# Patient Record
Sex: Female | Born: 1970 | Race: Black or African American | Hispanic: No | Marital: Married | Smoking: Never smoker
Health system: Southern US, Community
[De-identification: ages and names within clinical notes are randomized; demographics above are authoritative.]

## PROBLEM LIST (undated history)

## (undated) DIAGNOSIS — G009 Bacterial meningitis, unspecified: Secondary | ICD-10-CM

## (undated) DIAGNOSIS — J40 Bronchitis, not specified as acute or chronic: Secondary | ICD-10-CM

---

## 2007-06-21 ENCOUNTER — Emergency Department (HOSPITAL_COMMUNITY): Admission: EM | Admit: 2007-06-21 | Discharge: 2007-06-21 | Payer: Self-pay | Admitting: Emergency Medicine

## 2009-10-28 ENCOUNTER — Emergency Department (HOSPITAL_BASED_OUTPATIENT_CLINIC_OR_DEPARTMENT_OTHER): Admission: EM | Admit: 2009-10-28 | Discharge: 2009-10-28 | Payer: Self-pay | Admitting: Emergency Medicine

## 2010-03-25 ENCOUNTER — Emergency Department (HOSPITAL_BASED_OUTPATIENT_CLINIC_OR_DEPARTMENT_OTHER)
Admission: EM | Admit: 2010-03-25 | Discharge: 2010-03-25 | Disposition: A | Discharge status: Home / Self Care | Payer: Self-pay | Attending: Emergency Medicine | Admitting: Emergency Medicine

## 2010-03-25 DIAGNOSIS — R51 Headache: Secondary | ICD-10-CM | POA: Insufficient documentation

## 2010-03-25 DIAGNOSIS — K089 Disorder of teeth and supporting structures, unspecified: Secondary | ICD-10-CM | POA: Insufficient documentation

## 2010-10-21 LAB — URINALYSIS, ROUTINE W REFLEX MICROSCOPIC
Nitrite: NEGATIVE
Specific Gravity, Urine: 1.021
Urobilinogen, UA: 0.2

## 2010-10-21 LAB — PREGNANCY, URINE: Preg Test, Ur: NEGATIVE

## 2010-10-21 LAB — POCT I-STAT, CHEM 8
Creatinine, Ser: 1
HCT: 38
Hemoglobin: 12.9
Potassium: 3.8
Sodium: 138

## 2010-10-21 LAB — DIFFERENTIAL
Eosinophils Absolute: 0.5
Lymphs Abs: 2.4
Neutrophils Relative %: 53

## 2010-10-21 LAB — CBC
MCV: 84.4
Platelets: 265
WBC: 7.6

## 2011-03-06 ENCOUNTER — Emergency Department (HOSPITAL_BASED_OUTPATIENT_CLINIC_OR_DEPARTMENT_OTHER)
Admission: EM | Admit: 2011-03-06 | Discharge: 2011-03-06 | Disposition: A | Discharge status: Home / Self Care | Payer: Self-pay | Attending: Emergency Medicine | Admitting: Emergency Medicine

## 2011-03-06 ENCOUNTER — Encounter (HOSPITAL_BASED_OUTPATIENT_CLINIC_OR_DEPARTMENT_OTHER): Payer: Self-pay | Admitting: Emergency Medicine

## 2011-03-06 ENCOUNTER — Emergency Department (HOSPITAL_BASED_OUTPATIENT_CLINIC_OR_DEPARTMENT_OTHER): Payer: Self-pay

## 2011-03-06 DIAGNOSIS — R059 Cough, unspecified: Secondary | ICD-10-CM | POA: Insufficient documentation

## 2011-03-06 DIAGNOSIS — Z79899 Other long term (current) drug therapy: Secondary | ICD-10-CM | POA: Insufficient documentation

## 2011-03-06 DIAGNOSIS — H109 Unspecified conjunctivitis: Secondary | ICD-10-CM | POA: Insufficient documentation

## 2011-03-06 DIAGNOSIS — J40 Bronchitis, not specified as acute or chronic: Secondary | ICD-10-CM | POA: Insufficient documentation

## 2011-03-06 DIAGNOSIS — R05 Cough: Secondary | ICD-10-CM | POA: Insufficient documentation

## 2011-03-06 HISTORY — DX: Bronchitis, not specified as acute or chronic: J40

## 2011-03-06 MED ORDER — AZITHROMYCIN 250 MG PO TABS: 250.0000 mg | ORAL_TABLET | Freq: Every day | ORAL | Status: AC

## 2011-03-06 MED ORDER — GENTAMICIN SULFATE 0.3 % OP SOLN: 1.0000 [drp] | OPHTHALMIC | Status: AC

## 2011-03-06 MED ORDER — HYDROCOD POLST-CHLORPHEN POLST 10-8 MG/5ML PO LQCR: 5.0000 mL | Freq: Every evening | ORAL | Status: DC | PRN

## 2011-03-06 NOTE — ED Provider Notes (Signed)
History     CSN: [id number]  Arrival date & time 03/06/11  1827   First MD Initiated Contact with Patient 03/06/11 2044      Chief Complaint  Patient presents with  . Cough     HPI Pt states she has a hx of bronchitis and has had a productive cough with thick, white sputum x 1 week. No relief with OTC meds.  Patient also complains of drainage and redness to left eye.  No pain or trauma.  Patient denies fever, nausea, vomiting.  Past Medical History  Diagnosis Date  . Bronchitis     No past surgical history on file.  No family history on file.  History  Substance Use Topics  . Smoking status: Not on file  . Smokeless tobacco: Not on file  . Alcohol Use:     OB History    Grav Para Term Preterm Abortions TAB SAB Ect Mult Living                  Review of Systems Negative except as noted in history of present illness Allergies  Review of patient's allergies indicates no known allergies.  Home Medications   Current Outpatient Rx  Name Route Sig Dispense Refill  . DIPHENHYDRAMINE-APAP (SLEEP) 25-500 MG PO TABS Oral Take 2 tablets by mouth at bedtime as needed. For pain and sleep    . PSEUDOEPH-DOXYLAMINE-DM-APAP 60-7.06-23-998 MG/30ML PO LIQD Oral Take 60 mLs by mouth at bedtime as needed. For cold symptoms    . AZITHROMYCIN 250 MG PO TABS Oral Take 1 tablet (250 mg total) by mouth daily. Take 2 tablets on day one then one tablet daily until gone 6 tablet 0  . HYDROCOD POLST-CPM POLST ER 10-8 MG/5ML PO LQCR Oral Take 5 mLs by mouth at bedtime as needed. 140 mL 0  . GENTAMICIN SULFATE 0.3 % OP SOLN Left Eye Place 1 drop into the left eye every 4 (four) hours. 5 mL 0    BP 112/80  Pulse 102  Temp(Src) 98.7 F (37.1 C) (Oral)  Resp 22  Ht 5\' 5"  (1.651 m)  SpO2 100%  LMP 02/20/2011  Physical Exam  Nursing note and vitals reviewed. Constitutional: She is oriented to person, place, and time. She appears well-developed and well-nourished. No distress.  HENT:    Head: Normocephalic and atraumatic.  Eyes: Pupils are equal, round, and reactive to light. Left eye exhibits discharge. Right conjunctiva is not injected. Left conjunctiva is injected. Left conjunctiva has no hemorrhage. Left eye exhibits normal extraocular motion.  Neck: Normal range of motion.  Cardiovascular: Normal rate and intact distal pulses.   Pulmonary/Chest: Effort normal and breath sounds normal. No respiratory distress.  Abdominal: Normal appearance. She exhibits no distension.  Musculoskeletal: Normal range of motion.  Neurological: She is alert and oriented to person, place, and time. No cranial nerve deficit.  Skin: Skin is warm and dry. No rash noted.  Psychiatric: She has a normal mood and affect. Her behavior is normal.    ED Course  Procedures (including critical care time)   No current facility-administered medications on file prior to encounter.   No current outpatient prescriptions on file prior to encounter.     1. Bronchitis   2. Conjunctivitis               Nelia Shi, MD 03/06/11 2059

## 2011-03-06 NOTE — ED Notes (Signed)
Pt states she has a hx of bronchitis and has had a productive cough with thick, white sputum x 1 week. No relief with OTC meds.

## 2011-12-20 ENCOUNTER — Emergency Department (HOSPITAL_BASED_OUTPATIENT_CLINIC_OR_DEPARTMENT_OTHER)
Admission: EM | Admit: 2011-12-20 | Discharge: 2011-12-20 | Disposition: A | Discharge status: Home / Self Care | Payer: No Typology Code available for payment source | Attending: Emergency Medicine | Admitting: Emergency Medicine

## 2011-12-20 ENCOUNTER — Emergency Department (HOSPITAL_BASED_OUTPATIENT_CLINIC_OR_DEPARTMENT_OTHER): Payer: No Typology Code available for payment source

## 2011-12-20 ENCOUNTER — Encounter (HOSPITAL_BASED_OUTPATIENT_CLINIC_OR_DEPARTMENT_OTHER): Payer: Self-pay | Admitting: *Deleted

## 2011-12-20 DIAGNOSIS — Z8709 Personal history of other diseases of the respiratory system: Secondary | ICD-10-CM | POA: Insufficient documentation

## 2011-12-20 DIAGNOSIS — Y92410 Unspecified street and highway as the place of occurrence of the external cause: Secondary | ICD-10-CM | POA: Insufficient documentation

## 2011-12-20 DIAGNOSIS — S39012A Strain of muscle, fascia and tendon of lower back, initial encounter: Secondary | ICD-10-CM

## 2011-12-20 DIAGNOSIS — IMO0002 Reserved for concepts with insufficient information to code with codable children: Secondary | ICD-10-CM | POA: Insufficient documentation

## 2011-12-20 DIAGNOSIS — Y9389 Activity, other specified: Secondary | ICD-10-CM | POA: Insufficient documentation

## 2011-12-20 MED ORDER — IBUPROFEN 800 MG PO TABS: 800.0000 mg | ORAL_TABLET | Freq: Three times a day (TID) | ORAL | Status: DC

## 2011-12-20 MED ORDER — IBUPROFEN 800 MG PO TABS
800.0000 mg | ORAL_TABLET | Freq: Once | ORAL | Status: AC
  Administered 2011-12-20: 800 mg via ORAL
  Filled 2011-12-20: qty 1

## 2011-12-20 NOTE — ED Notes (Signed)
MD at bedside. 

## 2011-12-20 NOTE — ED Notes (Signed)
Patient transported to X-ray 
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
## 2012-04-17 ENCOUNTER — Other Ambulatory Visit: Payer: Self-pay

## 2012-04-17 DIAGNOSIS — Z1231 Encounter for screening mammogram for malignant neoplasm of breast: Secondary | ICD-10-CM

## 2012-05-05 ENCOUNTER — Ambulatory Visit
Admission: RE | Admit: 2012-05-05 | Discharge: 2012-05-05 | Disposition: A | Discharge status: Home / Self Care | Payer: BC Managed Care – PPO | Source: Ambulatory Visit

## 2012-05-05 DIAGNOSIS — Z1231 Encounter for screening mammogram for malignant neoplasm of breast: Secondary | ICD-10-CM

## 2012-05-08 ENCOUNTER — Other Ambulatory Visit: Payer: Self-pay | Admitting: Family Medicine

## 2012-05-08 DIAGNOSIS — R928 Other abnormal and inconclusive findings on diagnostic imaging of breast: Secondary | ICD-10-CM

## 2012-05-25 ENCOUNTER — Ambulatory Visit
Admission: RE | Admit: 2012-05-25 | Discharge: 2012-05-25 | Disposition: A | Discharge status: Home / Self Care | Payer: BC Managed Care – PPO | Source: Ambulatory Visit | Attending: Family Medicine | Admitting: Family Medicine

## 2012-05-25 DIAGNOSIS — R928 Other abnormal and inconclusive findings on diagnostic imaging of breast: Secondary | ICD-10-CM

## 2012-10-06 ENCOUNTER — Emergency Department (HOSPITAL_BASED_OUTPATIENT_CLINIC_OR_DEPARTMENT_OTHER)
Admission: EM | Admit: 2012-10-06 | Discharge: 2012-10-06 | Disposition: A | Discharge status: Home / Self Care | Payer: No Typology Code available for payment source | Attending: Emergency Medicine | Admitting: Emergency Medicine

## 2012-10-06 ENCOUNTER — Encounter (HOSPITAL_BASED_OUTPATIENT_CLINIC_OR_DEPARTMENT_OTHER): Payer: Self-pay

## 2012-10-06 DIAGNOSIS — G43909 Migraine, unspecified, not intractable, without status migrainosus: Secondary | ICD-10-CM | POA: Insufficient documentation

## 2012-10-06 DIAGNOSIS — Z8709 Personal history of other diseases of the respiratory system: Secondary | ICD-10-CM | POA: Insufficient documentation

## 2012-10-06 LAB — PREGNANCY, URINE: Preg Test, Ur: NEGATIVE

## 2012-10-06 MED ORDER — METOCLOPRAMIDE HCL 5 MG/ML IJ SOLN
5.0000 mg | Freq: Once | INTRAMUSCULAR | Status: AC
  Administered 2012-10-06: 5 mg via INTRAVENOUS
  Filled 2012-10-06: qty 2

## 2012-10-06 MED ORDER — DIPHENHYDRAMINE HCL 50 MG/ML IJ SOLN
25.0000 mg | Freq: Once | INTRAMUSCULAR | Status: AC
  Administered 2012-10-06: 25 mg via INTRAVENOUS
  Filled 2012-10-06: qty 1

## 2012-10-06 MED ORDER — HALOPERIDOL LACTATE 5 MG/ML IJ SOLN
2.0000 mg | Freq: Once | INTRAMUSCULAR | Status: AC
  Administered 2012-10-06: 2 mg via INTRAVENOUS
  Filled 2012-10-06: qty 1

## 2012-10-06 MED ORDER — SODIUM CHLORIDE 0.9 % IV BOLUS (SEPSIS)
1000.0000 mL | INTRAVENOUS | Status: AC
  Administered 2012-10-06: 1000 mL via INTRAVENOUS

## 2012-10-06 MED ORDER — DEXAMETHASONE SODIUM PHOSPHATE 10 MG/ML IJ SOLN
10.0000 mg | Freq: Once | INTRAMUSCULAR | Status: AC
  Administered 2012-10-06: 10 mg via INTRAVENOUS
  Filled 2012-10-06: qty 1

## 2012-10-06 NOTE — ED Provider Notes (Signed)
CSN: [id number]     Arrival date & time 10/06/12  0709 History   First MD Initiated Contact with Patient 10/06/12 [phone]     Chief Complaint  Patient presents with  . Headache   (Consider location/radiation/quality/duration/timing/severity/associated sxs/prior Treatment) Patient is a 42 y.o. female presenting with headaches. The history is provided by the patient.  Headache Pain location:  Frontal and occipital Quality:  Dull Radiates to: occipital area/upper neck. Severity currently:  8/10 Severity at highest:  10/10 Onset quality:  Gradual Duration:  5 days Timing:  Constant Progression:  Waxing and waning Chronicity:  New Similar to prior headaches: yes   Context: bright light   Relieved by:  Nothing Worsened by:  Activity Ineffective treatments:  NSAIDs Associated symptoms: nausea and vomiting (once here today)   Associated symptoms: no abdominal pain, no back pain, no congestion, no cough, no diarrhea, no dizziness, no pain, no fatigue, no fever and no neck pain     Past Medical History  Diagnosis Date  . Bronchitis    History reviewed. No pertinent past surgical history. No family history on file. History  Substance Use Topics  . Smoking status: Never Smoker   . Smokeless tobacco: Not on file  . Alcohol Use: Yes     Comment: occasional   OB History   Grav Para Term Preterm Abortions TAB SAB Ect Mult Living                 Review of Systems  Constitutional: Negative for fever and fatigue.  HENT: Negative for congestion, drooling and neck pain.   Eyes: Negative for pain.  Respiratory: Negative for cough and shortness of breath.   Cardiovascular: Negative for chest pain.  Gastrointestinal: Positive for nausea and vomiting (once here today). Negative for abdominal pain and diarrhea.  Genitourinary: Negative for dysuria and hematuria.  Musculoskeletal: Negative for back pain and gait problem.  Skin: Negative for color change.  Neurological: Positive for  headaches. Negative for dizziness.  Hematological: Negative for adenopathy.  Psychiatric/Behavioral: Negative for behavioral problems.  All other systems reviewed and are negative.    Allergies  Review of patient's allergies indicates no known allergies.  Home Medications  No current outpatient prescriptions on file. BP 139/92  Pulse 51  Temp(Src) 99.3 F (37.4 C) (Oral)  Resp 18  Ht 5\' 4"  (1.626 m)  Wt 170 lb (77.111 kg)  BMI 29.17 kg/m2  SpO2 98%  LMP 10/01/2012 Physical Exam  Nursing note and vitals reviewed. Constitutional: She is oriented to person, place, and time. She appears well-developed and well-nourished.  HENT:  Head: Normocephalic.  Mouth/Throat: No oropharyngeal exudate.  Eyes: Conjunctivae and EOM are normal. Pupils are equal, round, and reactive to light.  Neck: Normal range of motion. Neck supple.  Cardiovascular: Normal rate, regular rhythm, normal heart sounds and intact distal pulses.  Exam reveals no gallop and no friction rub.   No murmur heard. Pulmonary/Chest: Effort normal and breath sounds normal. No respiratory distress. She has no wheezes.  Abdominal: Soft. Bowel sounds are normal. There is no tenderness. There is no rebound and no guarding.  Musculoskeletal: Normal range of motion. She exhibits no edema and no tenderness.  Neurological: She is alert and oriented to person, place, and time.  Skin: Skin is warm and dry.  Psychiatric: She has a normal mood and affect. Her behavior is normal.    ED Course  Procedures (including critical care time) Labs Review Labs Reviewed  PREGNANCY, URINE   Imaging  Review No results found.  MDM   1. Migraine    7:36 AM 42 y.o. female who presents with gradual onset of headache which began 4 days ago and has been waxing and waning since. The patient notes only mild relief with over-the-counter NSAIDs. She denies any fevers, vomiting, vision changes. She did have one episode of small emesis here. She is  afebrile and vital signs are unremarkable here. She appears well on exam. Will give migraine cocktail.  Pt notes mild relief in HA. Will give small dose of haldol.  10:16 AM: Pt feeling much better would like to go home.  I have discussed the diagnosis/risks/treatment options with the patient and believe the pt to be eligible for discharge home to follow-up with pcp as needed. We also discussed returning to the ED immediately if new or worsening sx occur. We discussed the sx which are most concerning (e.g., worsening HA, fever) that necessitate immediate return. Any new prescriptions provided to the patient are listed below.  New Prescriptions   No medications on file     Junius Argyle, MD 10/07/12 1219

## 2012-10-06 NOTE — ED Notes (Signed)
Headache that started Monday unrelieved after taking OTC medications.

## 2012-10-08 ENCOUNTER — Emergency Department (HOSPITAL_BASED_OUTPATIENT_CLINIC_OR_DEPARTMENT_OTHER)
Admission: EM | Admit: 2012-10-08 | Discharge: 2012-10-08 | Disposition: A | Discharge status: Home / Self Care | Payer: No Typology Code available for payment source | Attending: Emergency Medicine | Admitting: Emergency Medicine

## 2012-10-08 ENCOUNTER — Encounter (HOSPITAL_BASED_OUTPATIENT_CLINIC_OR_DEPARTMENT_OTHER): Payer: Self-pay | Admitting: Emergency Medicine

## 2012-10-08 DIAGNOSIS — R110 Nausea: Secondary | ICD-10-CM | POA: Insufficient documentation

## 2012-10-08 DIAGNOSIS — J3489 Other specified disorders of nose and nasal sinuses: Secondary | ICD-10-CM | POA: Insufficient documentation

## 2012-10-08 DIAGNOSIS — R509 Fever, unspecified: Secondary | ICD-10-CM | POA: Insufficient documentation

## 2012-10-08 DIAGNOSIS — R51 Headache: Secondary | ICD-10-CM | POA: Insufficient documentation

## 2012-10-08 DIAGNOSIS — R5381 Other malaise: Secondary | ICD-10-CM | POA: Insufficient documentation

## 2012-10-08 DIAGNOSIS — Z8709 Personal history of other diseases of the respiratory system: Secondary | ICD-10-CM | POA: Insufficient documentation

## 2012-10-08 DIAGNOSIS — H53149 Visual discomfort, unspecified: Secondary | ICD-10-CM | POA: Insufficient documentation

## 2012-10-08 DIAGNOSIS — Z79899 Other long term (current) drug therapy: Secondary | ICD-10-CM | POA: Insufficient documentation

## 2012-10-08 DIAGNOSIS — IMO0002 Reserved for concepts with insufficient information to code with codable children: Secondary | ICD-10-CM | POA: Insufficient documentation

## 2012-10-08 MED ORDER — IBUPROFEN 600 MG PO TABS: 600.0000 mg | ORAL_TABLET | Freq: Four times a day (QID) | ORAL | Status: AC | PRN

## 2012-10-08 MED ORDER — OXYMETAZOLINE HCL 0.05 % NA SOLN: 2.0000 | Freq: Two times a day (BID) | NASAL | Status: AC

## 2012-10-08 MED ORDER — DIPHENHYDRAMINE HCL 25 MG PO CAPS
25.0000 mg | ORAL_CAPSULE | Freq: Once | ORAL | Status: AC
  Administered 2012-10-08: 25 mg via ORAL
  Filled 2012-10-08: qty 1

## 2012-10-08 MED ORDER — METOCLOPRAMIDE HCL 10 MG PO TABS: 10.0000 mg | ORAL_TABLET | Freq: Four times a day (QID) | ORAL | Status: AC | PRN

## 2012-10-08 MED ORDER — METOCLOPRAMIDE HCL 10 MG PO TABS
10.0000 mg | ORAL_TABLET | Freq: Once | ORAL | Status: AC
  Administered 2012-10-08: 10 mg via ORAL
  Filled 2012-10-08: qty 1

## 2012-10-08 MED ORDER — MOMETASONE FUROATE 50 MCG/ACT NA SUSP: 2.0000 | Freq: Every day | NASAL | Status: AC

## 2012-10-08 MED ORDER — OXYCODONE-ACETAMINOPHEN 5-325 MG PO TABS
1.0000 | ORAL_TABLET | Freq: Once | ORAL | Status: AC
  Administered 2012-10-08: 1 via ORAL
  Filled 2012-10-08 (×2): qty 1

## 2012-10-08 MED ORDER — KETOROLAC TROMETHAMINE 60 MG/2ML IM SOLN
60.0000 mg | Freq: Once | INTRAMUSCULAR | Status: AC
  Administered 2012-10-08: 60 mg via INTRAMUSCULAR
  Filled 2012-10-08: qty 2

## 2012-10-08 MED ORDER — OXYCODONE-ACETAMINOPHEN 5-325 MG PO TABS: 2.0000 | ORAL_TABLET | ORAL | Status: AC | PRN

## 2012-10-08 NOTE — ED Notes (Signed)
Pt reports severe headache onset 2pm seen 1 day ago at [facility] for same and pain has returned

## 2012-10-08 NOTE — ED Provider Notes (Signed)
CSN: [id number]     Arrival date & time 10/08/12  0010 History   First MD Initiated Contact with Patient 10/08/12 0030     Chief Complaint  Patient presents with  . Headache   (Consider location/radiation/quality/duration/timing/severity/associated sxs/prior Treatment) HPI Patient presents with 6-7 days of facial pain radiating to right ear and posterior scalp. She was seen yesterday and treated for headache with improvement in symptoms patient states that symptoms worsened again today around 2 PM. Symptoms have been gradual onset. Described as pressure in her face and head. Pain is worse when bending forward. She has nasal congestion and low-grade fevers. She has no neck pain or stiffness. Chest no focal weakness. Patient does complain of nausea and some photophobia. Past Medical History  Diagnosis Date  . Bronchitis    History reviewed. No pertinent past surgical history. History reviewed. No pertinent family history. History  Substance Use Topics  . Smoking status: Never Smoker   . Smokeless tobacco: Not on file  . Alcohol Use: Yes     Comment: occasional   OB History   Grav Para Term Preterm Abortions TAB SAB Ect Mult Living                 Review of Systems  Constitutional: Positive for fever and fatigue. Negative for chills.  HENT: Positive for congestion and sinus pressure. Negative for sore throat, facial swelling, neck pain and neck stiffness.   Eyes: Positive for photophobia. Negative for visual disturbance.  Respiratory: Negative for cough, shortness of breath and wheezing.   Cardiovascular: Negative for chest pain.  Gastrointestinal: Positive for nausea. Negative for vomiting, abdominal pain, diarrhea and constipation.  Musculoskeletal: Negative for back pain.  Skin: Negative for rash and wound.  Neurological: Positive for headaches. Negative for dizziness, syncope, weakness, light-headedness and numbness.  All other systems reviewed and are  negative.    Allergies  Review of patient's allergies indicates no known allergies.  Home Medications   Current Outpatient Rx  Name  Route  Sig  Dispense  Refill  . ibuprofen (ADVIL,MOTRIN) 600 MG tablet   Oral   Take 1 tablet (600 mg total) by mouth every 6 (six) hours as needed for pain.   30 tablet   0   . metoCLOPramide (REGLAN) 10 MG tablet   Oral   Take 1 tablet (10 mg total) by mouth every 6 (six) hours as needed (nausea/headache).   6 tablet   0   . mometasone (NASONEX) 50 MCG/ACT nasal spray   Nasal   Place 2 sprays into the nose daily.   17 g   12   . oxyCODONE-acetaminophen (PERCOCET) 5-325 MG per tablet   Oral   Take 2 tablets by mouth every 4 (four) hours as needed for pain.   10 tablet   0   . oxymetazoline (AFRIN NASAL SPRAY) 0.05 % nasal spray   Nasal   Place 2 sprays into the nose 2 (two) times daily.   30 mL   0    BP 132/83  Pulse 75  Temp(Src) 99.4 F (37.4 C) (Oral)  Resp 18  SpO2 100%  LMP 10/01/2012 Physical Exam  Nursing note and vitals reviewed. Constitutional: She is oriented to person, place, and time. She appears well-developed and well-nourished.  Patient appears uncomfortable sitting in the dark.  HENT:  Head: Normocephalic and atraumatic.  Mouth/Throat: Oropharynx is clear and moist. No oropharyngeal exudate.  Bilateral nasal turbinate swelling. Patient has tenderness to palpation over her right  frontal and right maxillary sinuses.  Eyes: EOM are normal. Pupils are equal, round, and reactive to light.  Neck: Normal range of motion. Neck supple.  No nuchal rigidity or evidence of meningismus  Cardiovascular: Normal rate and regular rhythm.   Pulmonary/Chest: Effort normal and breath sounds normal. No respiratory distress. She has no wheezes. She has no rales.  Abdominal: Soft. Bowel sounds are normal. She exhibits no distension and no mass. There is no tenderness. There is no rebound and no guarding.  Musculoskeletal: Normal  range of motion. She exhibits no edema and no tenderness.  Neurological: She is alert and oriented to person, place, and time.  Patient is alert and oriented x3 with clear, goal oriented speech. Patient has 5/5 motor in all extremities. Sensation is intact to light touch. Bilateral finger-to-nose is normal with no signs of dysmetria. Patient has a normal gait and walks without assistance.   Skin: Skin is warm and dry. No rash noted. No erythema.  Psychiatric: She has a normal mood and affect. Her behavior is normal.    ED Course  Procedures (including critical care time) Labs Review Labs Reviewed - No data to display Imaging Review No results found.  MDM   1. Sinus headache    Patient says she is feeling much better after medications. She has a normal neurologic exam. I suspect the patient has sinus headache but does have features of a migraine. Patient will be discharged with oral medication and nasal sprays to treat both. Detail return precautions have been given.   Loren Racer, MD 10/08/12 [phone]

## 2013-05-28 ENCOUNTER — Other Ambulatory Visit (HOSPITAL_BASED_OUTPATIENT_CLINIC_OR_DEPARTMENT_OTHER): Payer: Self-pay | Admitting: Family Medicine

## 2013-05-28 DIAGNOSIS — Z1231 Encounter for screening mammogram for malignant neoplasm of breast: Secondary | ICD-10-CM

## 2013-05-30 ENCOUNTER — Ambulatory Visit (HOSPITAL_BASED_OUTPATIENT_CLINIC_OR_DEPARTMENT_OTHER)
Admission: RE | Admit: 2013-05-30 | Discharge: 2013-05-30 | Disposition: A | Discharge status: Home / Self Care | Payer: No Typology Code available for payment source | Source: Ambulatory Visit | Attending: Family Medicine | Admitting: Family Medicine

## 2013-05-30 DIAGNOSIS — R922 Inconclusive mammogram: Secondary | ICD-10-CM | POA: Insufficient documentation

## 2013-05-30 DIAGNOSIS — Z1231 Encounter for screening mammogram for malignant neoplasm of breast: Secondary | ICD-10-CM | POA: Insufficient documentation
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
## 2013-10-29 ENCOUNTER — Encounter (HOSPITAL_BASED_OUTPATIENT_CLINIC_OR_DEPARTMENT_OTHER): Payer: Self-pay | Admitting: Emergency Medicine

## 2013-10-29 ENCOUNTER — Emergency Department (HOSPITAL_BASED_OUTPATIENT_CLINIC_OR_DEPARTMENT_OTHER)
Admission: EM | Admit: 2013-10-29 | Discharge: 2013-10-29 | Disposition: A | Discharge status: Home / Self Care | Payer: No Typology Code available for payment source | Attending: Emergency Medicine | Admitting: Emergency Medicine

## 2013-10-29 DIAGNOSIS — Z791 Long term (current) use of non-steroidal anti-inflammatories (NSAID): Secondary | ICD-10-CM | POA: Diagnosis not present

## 2013-10-29 DIAGNOSIS — Z8709 Personal history of other diseases of the respiratory system: Secondary | ICD-10-CM | POA: Insufficient documentation

## 2013-10-29 DIAGNOSIS — S61412A Laceration without foreign body of left hand, initial encounter: Secondary | ICD-10-CM | POA: Diagnosis present

## 2013-10-29 DIAGNOSIS — Z7951 Long term (current) use of inhaled steroids: Secondary | ICD-10-CM | POA: Insufficient documentation

## 2013-10-29 DIAGNOSIS — Y288XXA Contact with other sharp object, undetermined intent, initial encounter: Secondary | ICD-10-CM | POA: Diagnosis not present

## 2013-10-29 DIAGNOSIS — Z23 Encounter for immunization: Secondary | ICD-10-CM | POA: Diagnosis not present

## 2013-10-29 DIAGNOSIS — Z8669 Personal history of other diseases of the nervous system and sense organs: Secondary | ICD-10-CM | POA: Insufficient documentation

## 2013-10-29 DIAGNOSIS — Y929 Unspecified place or not applicable: Secondary | ICD-10-CM | POA: Diagnosis not present

## 2013-10-29 DIAGNOSIS — Y9389 Activity, other specified: Secondary | ICD-10-CM | POA: Diagnosis not present

## 2013-10-29 DIAGNOSIS — Z79899 Other long term (current) drug therapy: Secondary | ICD-10-CM | POA: Insufficient documentation

## 2013-10-29 HISTORY — DX: Bacterial meningitis, unspecified: G00.9

## 2013-10-29 MED ORDER — TETANUS-DIPHTH-ACELL PERTUSSIS 5-2.5-18.5 LF-MCG/0.5 IM SUSP
0.5000 mL | Freq: Once | INTRAMUSCULAR | Status: AC
  Administered 2013-10-29: 0.5 mL via INTRAMUSCULAR
  Filled 2013-10-29: qty 0.5

## 2013-10-29 MED ORDER — LIDOCAINE HCL 2 % IJ SOLN
INTRAMUSCULAR | Status: AC
  Filled 2013-10-29: qty 20

## 2013-10-29 MED ORDER — BACITRACIN 500 UNIT/GM EX OINT
1.0000 "application " | TOPICAL_OINTMENT | Freq: Two times a day (BID) | CUTANEOUS | Status: DC
  Filled 2013-10-29: qty 0.9
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
## 2013-10-29 NOTE — ED Provider Notes (Signed)
  Medical screening examination/treatment/procedure(s) were performed by non-physician practitioner and as supervising physician I was immediately available for consultation/collaboration.   EKG Interpretation None         Gerhard Munchobert Kalel Harty, MD 10/29/13 2359

## 2013-10-29 NOTE — ED Provider Notes (Signed)
CSN: [id number]     Arrival date & time 10/29/13  1923 History   First MD Initiated Contact with Patient 10/29/13 2234     Chief Complaint  Patient presents with  . Laceration     (Consider location/radiation/quality/duration/timing/severity/associated sxs/prior Treatment) Patient is a 43 y.o. female presenting with skin laceration. The history is provided by the patient.  Laceration Location:  Hand Hand laceration location:  L hand Depth:  Through dermis Bleeding: controlled   Time since incident:  7 hours Laceration mechanism:  Metal edge Pain details:    Quality:  Dull   Severity:  Mild   Timing:  Constant Foreign body present:  No foreign bodies Relieved by:  None tried Worsened by:  Nothing tried Ineffective treatments:  None tried Tetanus status:  Out of date   Past Medical History  Diagnosis Date  . Bronchitis   . Meningitis due to bacteria    History reviewed. No pertinent past surgical history. History reviewed. No pertinent family history. History  Substance Use Topics  . Smoking status: Never Smoker   . Smokeless tobacco: Not on file  . Alcohol Use: Yes     Comment: occasional   OB History   Grav Para Term Preterm Abortions TAB SAB Ect Mult Living                 Review of Systems    Allergies  Review of patient's allergies indicates no known allergies.  Home Medications   Prior to Admission medications   Medication Sig Start Date End Date Taking? Authorizing Provider  ibuprofen (ADVIL,MOTRIN) 600 MG tablet Take 1 tablet (600 mg total) by mouth every 6 (six) hours as needed for pain. 10/08/12   Loren Raceravid Yelverton, MD  metoCLOPramide (REGLAN) 10 MG tablet Take 1 tablet (10 mg total) by mouth every 6 (six) hours as needed (nausea/headache). 10/08/12   Loren Raceravid Yelverton, MD  mometasone (NASONEX) 50 MCG/ACT nasal spray Place 2 sprays into the nose daily. 10/08/12   Loren Raceravid Yelverton, MD  oxyCODONE-acetaminophen (PERCOCET) 5-325 MG per tablet Take 2 tablets  by mouth every 4 (four) hours as needed for pain. 10/08/12   Loren Raceravid Yelverton, MD  oxymetazoline (AFRIN NASAL SPRAY) 0.05 % nasal spray Place 2 sprays into the nose 2 (two) times daily. 10/08/12   Loren Raceravid Yelverton, MD   BP 134/87  Pulse 66  Temp(Src) 98.6 F (37 C) (Oral)  Resp 16  Ht 5\' 5"  (1.651 m)  Wt 180 lb (81.647 kg)  BMI 29.95 kg/m2  SpO2 100%  LMP 10/22/2013 Physical Exam  Nursing note and vitals reviewed. Constitutional: She is oriented to person, place, and time. She appears well-developed and well-nourished.  HENT:  Head: Normocephalic and atraumatic.  Eyes: Conjunctivae and EOM are normal.  Neck: Neck supple.  Cardiovascular: Normal rate.   Pulmonary/Chest: Effort normal.  Musculoskeletal: Normal range of motion.       Hands: Laceration to the left hand palmar aspect between the thumb and index finger. Good touch sensation, good strength.   Neurological: She is alert and oriented to person, place, and time. No cranial nerve deficit.  Skin: Skin is warm and dry.  Psychiatric: She has a normal mood and affect. Her behavior is normal.    ED Course  Procedures  LACERATION REPAIR Performed by: NEESE,HOPE Authorized by: NEESE,HOPE Consent: Verbal consent obtained. Risks and benefits: risks, benefits and alternatives were discussed Consent given by: patient Patient identity confirmed: provided demographic data Prepped and Draped in normal sterile fashion Wound  explored  Laceration Location: left hand  Laceration Length: 2 cm  No Foreign Bodies seen or palpated  Anesthesia: local infiltration  Local anesthetic: lidocaine 1% without epinephrine  Anesthetic total: 1 ml  Irrigation method: syringe Amount of cleaning: standard  Skin closure: 5-0 prolene  Number of sutures: 2  Technique: interrupted  Bacitracin ointment and dressing  Patient tolerance: Patient tolerated the procedure well with no immediate complications.  MDM  43 y.o. female with  laceration to the palmar aspect of the left hand. Stable for discharge without neurovascular deficits. Tetanus updated, she will follow up for suture removal in 7 days. She will return here as needed for any problems.     Janne Napoleon, [place] 10/29/13 [phone]
